# Patient Record
Sex: Male | Born: 1990 | Race: White | Hispanic: No | Marital: Single | Smoking: Current every day smoker
Health system: Southern US, Community
[De-identification: ages and names within clinical notes are randomized; demographics above are authoritative.]

---

## 2015-05-16 ENCOUNTER — Emergency Department (HOSPITAL_COMMUNITY): Payer: 59

## 2015-05-16 ENCOUNTER — Emergency Department (HOSPITAL_COMMUNITY)
Admission: EM | Admit: 2015-05-16 | Discharge: 2015-05-16 | Disposition: A | Discharge status: Home / Self Care | Payer: 59 | Attending: Emergency Medicine | Admitting: Emergency Medicine

## 2015-05-16 ENCOUNTER — Encounter (HOSPITAL_COMMUNITY): Payer: Self-pay | Admitting: Emergency Medicine

## 2015-05-16 DIAGNOSIS — F17210 Nicotine dependence, cigarettes, uncomplicated: Secondary | ICD-10-CM | POA: Insufficient documentation

## 2015-05-16 DIAGNOSIS — Y999 Unspecified external cause status: Secondary | ICD-10-CM | POA: Insufficient documentation

## 2015-05-16 DIAGNOSIS — Y9389 Activity, other specified: Secondary | ICD-10-CM | POA: Insufficient documentation

## 2015-05-16 DIAGNOSIS — Y929 Unspecified place or not applicable: Secondary | ICD-10-CM | POA: Insufficient documentation

## 2015-05-16 DIAGNOSIS — S299XXA Unspecified injury of thorax, initial encounter: Secondary | ICD-10-CM | POA: Diagnosis present

## 2015-05-16 DIAGNOSIS — X58XXXA Exposure to other specified factors, initial encounter: Secondary | ICD-10-CM | POA: Diagnosis not present

## 2015-05-16 DIAGNOSIS — S20212A Contusion of left front wall of thorax, initial encounter: Secondary | ICD-10-CM | POA: Insufficient documentation

## 2015-05-16 DIAGNOSIS — Z791 Long term (current) use of non-steroidal anti-inflammatories (NSAID): Secondary | ICD-10-CM | POA: Diagnosis not present

## 2015-05-16 MED ORDER — HYDROCODONE-ACETAMINOPHEN 5-325 MG PO TABS: ORAL_TABLET | ORAL | Status: AC
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
## 2015-05-16 NOTE — Discharge Instructions (Signed)
Rib Contusion A rib contusion is a deep bruise on your rib area. Contusions are the result of a blunt trauma that causes bleeding and injury to the tissues under the skin. A rib contusion may involve bruising of the ribs and of the skin and muscles in the area. The skin overlying the contusion may turn blue, purple, or yellow. Minor injuries will give you a painless contusion, but more severe contusions may stay painful and swollen for a few weeks. CAUSES  A contusion is usually caused by a blow, trauma, or direct force to an area of the body. This often occurs while playing contact sports. SYMPTOMS  Swelling and redness of the injured area.  Discoloration of the injured area.  Tenderness and soreness of the injured area.  Pain with or without movement. DIAGNOSIS  The diagnosis can be made by taking a medical history and performing a physical exam. An X-ray, CT scan, or MRI may be needed to determine if there were any associated injuries, such as broken bones (fractures) or internal injuries. TREATMENT  Often, the best treatment for a rib contusion is rest. Icing or applying cold compresses to the injured area may help reduce swelling and inflammation. Deep breathing exercises may be recommended to reduce the risk of partial lung collapse and pneumonia. Over-the-counter or prescription medicines may also be recommended for pain control. HOME CARE INSTRUCTIONS   Apply ice to the injured area:  Put ice in a plastic bag.  Place a towel between your skin and the bag.  Leave the ice on for 20 minutes, 2-3 times per day.  Take medicines only as directed by your health care provider.  Rest the injured area. Avoid strenuous activity and any activities or movements that cause pain. Be careful during activities and avoid bumping the injured area.  Perform deep-breathing exercises as directed by your health care provider.  Do not lift anything that is heavier than 5 lb (2.3 kg) until your  health care provider approves.  Do not use any tobacco products, including cigarettes, chewing tobacco, or electronic cigarettes. If you need help quitting, ask your health care provider. SEEK MEDICAL CARE IF:   You have increased bruising or swelling.  You have pain that is not controlled with treatment.  You have a fever. SEEK IMMEDIATE MEDICAL CARE IF:   You have difficulty breathing or shortness of breath.  You develop a continual cough, or you cough up thick or bloody sputum.  You feel sick to your stomach (nauseous), you throw up (vomit), or you have abdominal pain.   This information is not intended to replace advice given to you by your health care provider. Make sure you discuss any questions you have with your health care provider.   Document Released: 09/18/2000 Document Revised: 01/14/2014 Document Reviewed: 10/05/2013 Elsevier Interactive Patient Education 2016 [employer]  
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
## 2019-07-07 ENCOUNTER — Emergency Department (HOSPITAL_BASED_OUTPATIENT_CLINIC_OR_DEPARTMENT_OTHER)
Admission: EM | Admit: 2019-07-07 | Discharge: 2019-07-07 | Disposition: A | Discharge status: Home / Self Care | Payer: 59 | Attending: Emergency Medicine | Admitting: Emergency Medicine

## 2019-07-07 ENCOUNTER — Encounter (HOSPITAL_BASED_OUTPATIENT_CLINIC_OR_DEPARTMENT_OTHER): Payer: Self-pay | Admitting: *Deleted

## 2019-07-07 ENCOUNTER — Other Ambulatory Visit: Payer: Self-pay

## 2019-07-07 ENCOUNTER — Emergency Department (HOSPITAL_BASED_OUTPATIENT_CLINIC_OR_DEPARTMENT_OTHER): Payer: 59

## 2019-07-07 DIAGNOSIS — F17210 Nicotine dependence, cigarettes, uncomplicated: Secondary | ICD-10-CM | POA: Insufficient documentation

## 2019-07-07 DIAGNOSIS — R42 Dizziness and giddiness: Secondary | ICD-10-CM | POA: Diagnosis present

## 2019-07-07 DIAGNOSIS — R0789 Other chest pain: Secondary | ICD-10-CM | POA: Diagnosis not present

## 2019-07-07 MED ORDER — IBUPROFEN 800 MG PO TABS: 800.0000 mg | ORAL_TABLET | Freq: Three times a day (TID) | ORAL | 0 refills | Status: AC | PRN

## 2019-07-07 NOTE — ED Provider Notes (Signed)
[facility] EMERGENCY DEPARTMENT Provider Note   CSN: [id number] Arrival date & time: 07/07/19  0800     History Chief Complaint  Patient presents with  . Chest Pain    Joel Larson is a 29 y.o. male.  He states he was at work today leaning over a pallet on his left chest when he acutely felt a pop and had some severe pain.  Difficulty taking a deep breath.  Made him feel dizzy lightheaded and sweaty like he was going to pass out.  Prior history of rib fractures.  Smoker.  Moderate pain still worse with movement and palpation.  The history is provided by the patient.  Chest Pain Pain location:  L chest Pain quality: sharp and stabbing   Pain radiates to:  Does not radiate Pain severity:  Severe Onset quality:  Sudden Timing:  Constant Progression:  Unchanged Chronicity:  New Context: trauma   Relieved by:  None tried Worsened by:  Deep breathing and movement Ineffective treatments:  None tried Associated symptoms: diaphoresis and dizziness   Associated symptoms: no abdominal pain, no back pain, no cough, no fever, no nausea, no shortness of breath and no vomiting   Risk factors: male sex and smoking        History reviewed. No pertinent past medical history.  There are no problems to display for this patient.   History reviewed. No pertinent surgical history.     No family history on file.  Social History   Tobacco Use  . Smoking status: Current Every Day Smoker    Packs/day: 1.00    Types: Cigarettes  Substance Use Topics  . Alcohol use: No  . Drug use: Yes    Types: Marijuana    Home Medications Prior to Admission medications   Medication Sig Start Date End Date Taking? Authorizing Provider  Multiple Vitamin (MULTIVITAMIN) tablet Take 1 tablet by mouth daily.   Yes [provider]  HYDROcodone-acetaminophen (NORCO/VICODIN) 5-325 MG tablet Take one tab po q 4-6 hrs prn pain 05/16/15   Triplett, Tammy, PA-C  ibuprofen (ADVIL,MOTRIN)  200 MG tablet Take 200 mg by mouth daily as needed for mild pain.    [provider]    Allergies    Patient has no known allergies.  Review of Systems   Review of Systems  Constitutional: Positive for diaphoresis. Negative for fever.  HENT: Negative for sore throat.   Eyes: Negative for visual disturbance.  Respiratory: Negative for cough and shortness of breath.   Cardiovascular: Positive for chest pain.  Gastrointestinal: Negative for abdominal pain, nausea and vomiting.  Genitourinary: Negative for dysuria.  Musculoskeletal: Negative for back pain.  Skin: Negative for rash.  Neurological: Positive for dizziness.    Physical Exam Updated Vital Signs BP 122/67 (BP Location: Right Arm)   Pulse 64   Temp 98 F (36.7 C) (Oral)   Resp 18   Ht 6\' 4"  (1.93 m)   Wt 77.1 kg   SpO2 100%   BMI 20.69 kg/m   Physical Exam Vitals and nursing note reviewed.  Constitutional:      Appearance: He is well-developed.  HENT:     Head: Normocephalic and atraumatic.  Eyes:     Conjunctiva/sclera: Conjunctivae normal.  Cardiovascular:     Rate and Rhythm: Normal rate and regular rhythm.     Heart sounds: No murmur heard.   Pulmonary:     Effort: Pulmonary effort is normal. No respiratory distress.     Breath  sounds: Normal breath sounds.  Chest:     Chest wall: Tenderness present. No crepitus.     Comments: He has some tenderness to his left anterior and lateral lower ribs.  No crepitus. Abdominal:     Palpations: Abdomen is soft.     Tenderness: There is no abdominal tenderness.  Musculoskeletal:        General: Normal range of motion.     Cervical back: Neck supple.     Right lower leg: No tenderness.     Left lower leg: No tenderness.  Skin:    General: Skin is warm and dry.     Capillary Refill: Capillary refill takes less than 2 seconds.  Neurological:     General: No focal deficit present.     Mental Status: He is alert.     Gait: Gait normal.     ED  Results / Procedures / Treatments   Labs (all labs ordered are listed, but only abnormal results are displayed) Labs Reviewed - No data to display  EKG None  Radiology DG Ribs Unilateral W/Chest Left  Result Date: 07/07/2019 CLINICAL DATA:  Pain following injury EXAM: LEFT RIBS AND CHEST - 3+ VIEW COMPARISON:  Chest and ribs May 16, 2015 FINDINGS: Frontal chest as well as oblique and cone-down rib images obtained. Lungs are clear. Heart size and pulmonary vascularity are normal. No adenopathy. No evident pneumothorax or pleural effusion. No evident rib fractures. IMPRESSION: No evident rib fractures.  Lungs clear. Electronically Signed   By: Bretta Bang III M.D.   On: 07/07/2019 09:02    Procedures Procedures (including critical care time)  Medications Ordered in ED Medications - No data to display  ED Course  I have reviewed the triage vital signs and the nursing notes.  Pertinent labs & imaging results that were available during my care of the patient were reviewed by me and considered in my medical decision making (see chart for details).  Clinical Course as of Jul 06 1909  Wed Jul 07, 2019  8413 Chest x-ray interpreted by me as no gross pneumothorax.  Awaiting radiology reading.   [MB]  [phone] Reviewed patient's chest x-ray results with him.  Return instructions discussed.   [MB]    Clinical Course User Index [MB] Terrilee Files, MD   MDM Rules/Calculators/A&P                          ddx includes, pneumothorax, rib fracture, contusion, pneumonia Final Clinical Impression(s) / ED Diagnoses Final diagnoses:  Chest wall pain    Rx / DC Orders ED Discharge Orders         Ordered    ibuprofen (ADVIL) 800 MG tablet  Every 8 hours PRN     Discontinue  Reprint     07/07/19 0918           Terrilee Files, MD 07/07/19 Jerene Bears

## 2019-07-07 NOTE — Discharge Instructions (Signed)
You were seen in the emergency department for left-sided rib pain.  Your x-ray did not show any pneumothorax or obvious rib fracture.  Please use ibuprofen 3 times a day with food.  Ice or heat to the affected area for comfort.  Return to the emergency department if any acute worsening shortness of breath or other concerning symptoms.

## 2019-07-07 NOTE — ED Triage Notes (Signed)
Leaning over pallet this am at work  inj to left ribs,  Felt a pop  Then had some diff breathing  Pain increased w inspiration and arm movement

## 2021-03-02 IMAGING — CR DG RIBS W/ CHEST 3+V*L*
3 series · 3 of 3 positions shown · non-contrast
Comparison: Chest and ribs May 16, 2015

CLINICAL DATA: Pain following injury

EXAM:
LEFT RIBS AND CHEST - 3+ VIEW

[w chest pa]
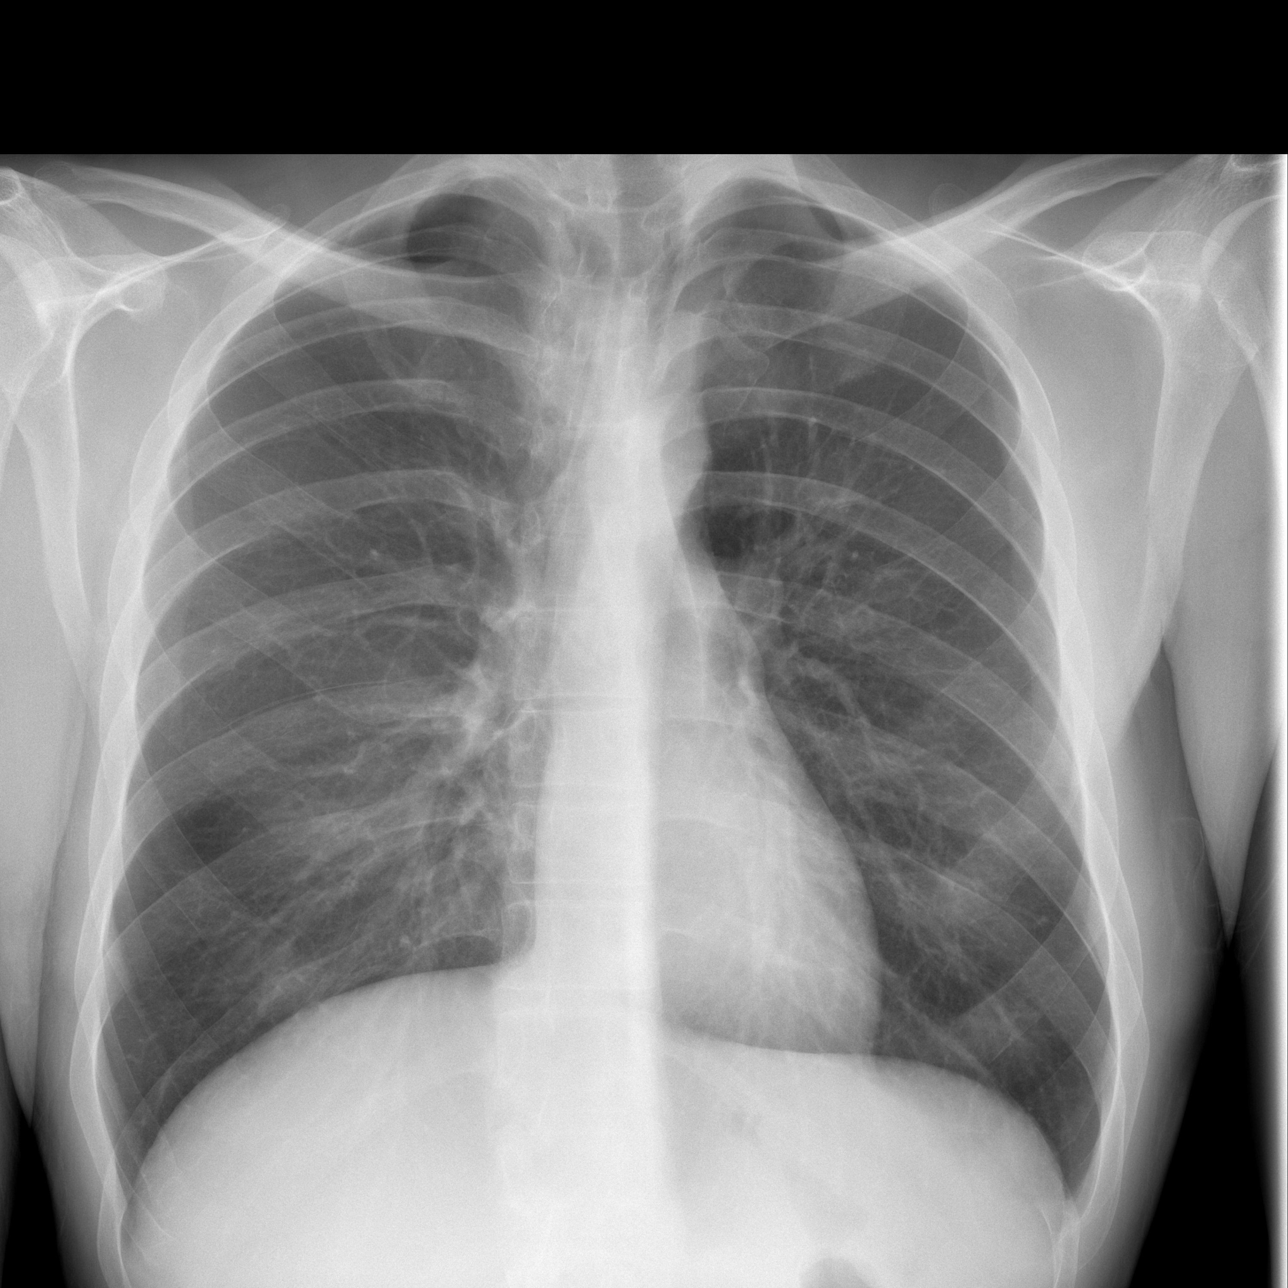

[w ribs ap/pa upper left]
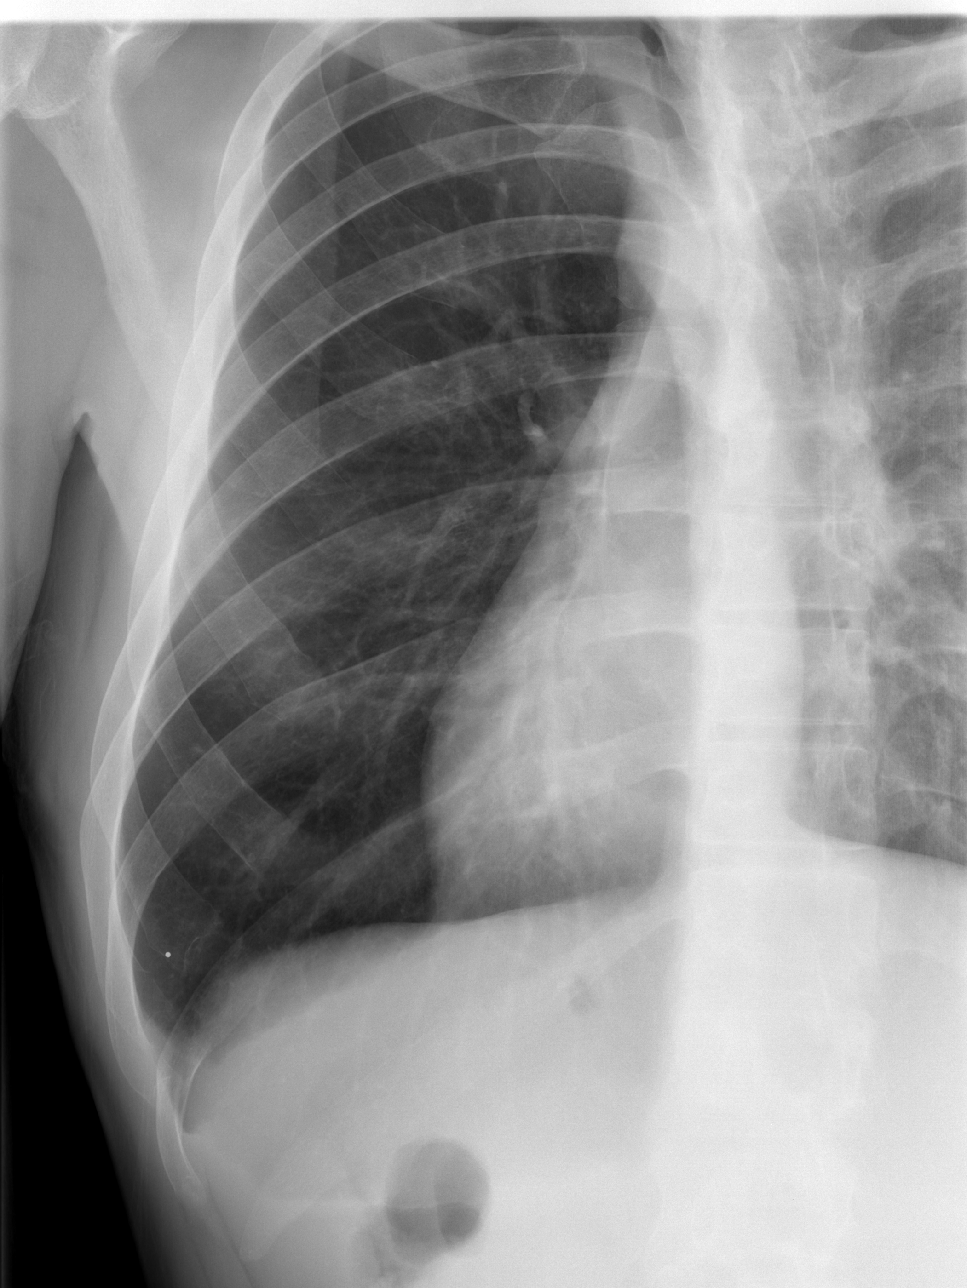

[w ribs ap/pa lower left]
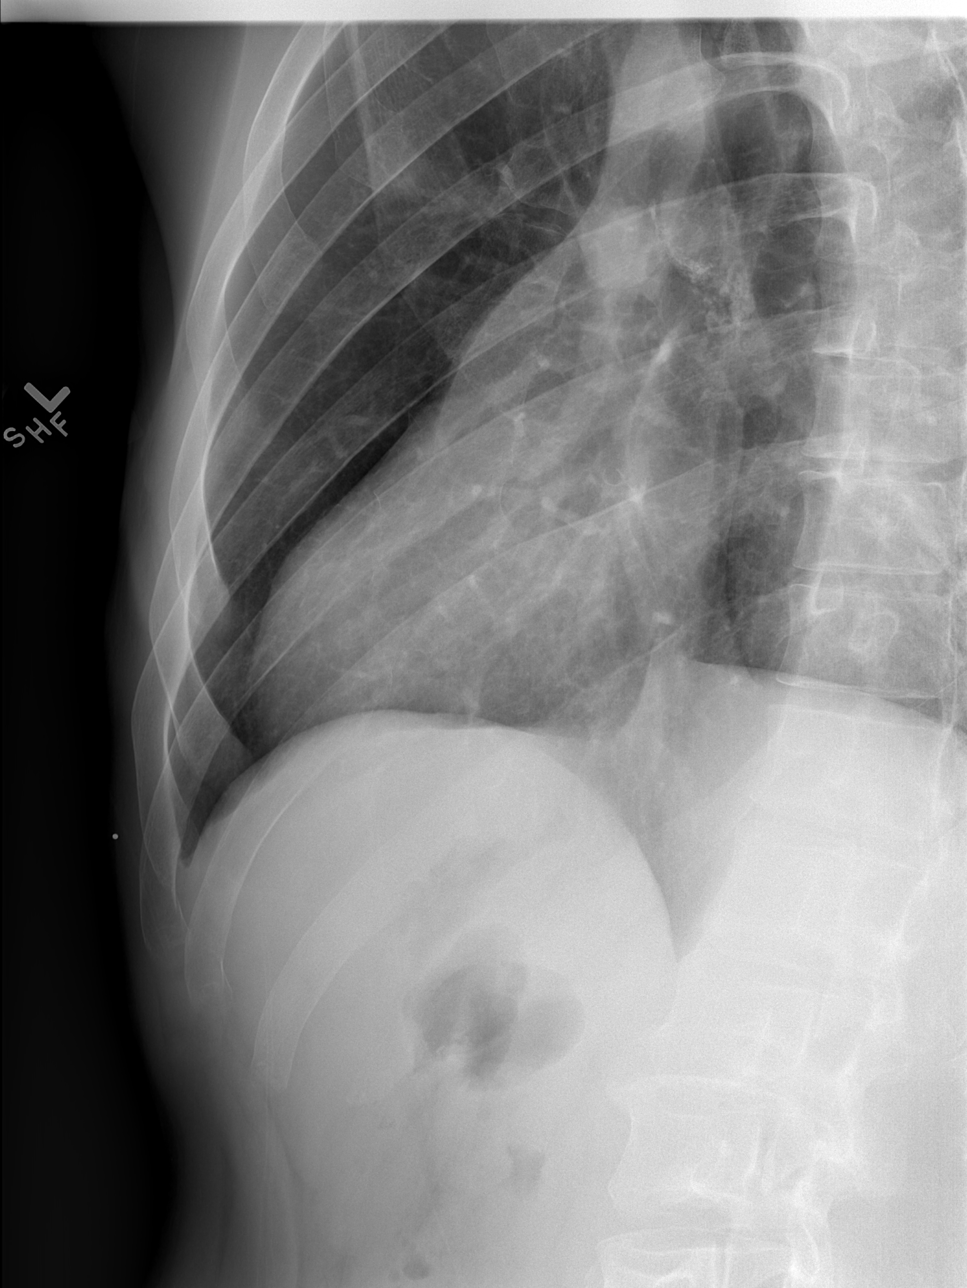

[3 of 3 positions shown; findings below may reference images not displayed]

FINDINGS: Frontal chest as well as oblique and cone-down rib images obtained.
Lungs are clear. Heart size and pulmonary vascularity are normal. No
adenopathy. No evident pneumothorax or pleural effusion. No evident
rib fractures.
IMPRESSION: No evident rib fractures.  Lungs clear.
